# Patient Record
Sex: Male | Born: 1960 | Race: White | Hispanic: No | Marital: Married | Smoking: Never smoker
Health system: Southern US, Community
[De-identification: ages and names within clinical notes are randomized; demographics above are authoritative.]

## PROBLEM LIST (undated history)

## (undated) DIAGNOSIS — I4510 Unspecified right bundle-branch block: Secondary | ICD-10-CM

## (undated) DIAGNOSIS — J189 Pneumonia, unspecified organism: Secondary | ICD-10-CM

---

## 1989-04-25 DIAGNOSIS — J189 Pneumonia, unspecified organism: Secondary | ICD-10-CM

## 1989-04-25 HISTORY — DX: Pneumonia, unspecified organism: J18.9

## 1992-08-25 HISTORY — PX: INGUINAL HERNIA REPAIR: SUR1180

## 2014-03-02 ENCOUNTER — Inpatient Hospital Stay (HOSPITAL_COMMUNITY)
Admission: EM | Admit: 2014-03-02 | Discharge: 2014-03-03 | DRG: 310 | Disposition: A | Discharge status: Home / Self Care | Payer: Federal, State, Local not specified - PPO | Attending: Cardiology | Admitting: Cardiology

## 2014-03-02 ENCOUNTER — Emergency Department (HOSPITAL_COMMUNITY): Payer: Federal, State, Local not specified - PPO

## 2014-03-02 ENCOUNTER — Encounter (HOSPITAL_COMMUNITY): Payer: Self-pay | Admitting: Emergency Medicine

## 2014-03-02 DIAGNOSIS — Z800 Family history of malignant neoplasm of digestive organs: Secondary | ICD-10-CM

## 2014-03-02 DIAGNOSIS — I4891 Unspecified atrial fibrillation: Secondary | ICD-10-CM

## 2014-03-02 DIAGNOSIS — Z8249 Family history of ischemic heart disease and other diseases of the circulatory system: Secondary | ICD-10-CM

## 2014-03-02 DIAGNOSIS — Z79899 Other long term (current) drug therapy: Secondary | ICD-10-CM

## 2014-03-02 DIAGNOSIS — Z801 Family history of malignant neoplasm of trachea, bronchus and lung: Secondary | ICD-10-CM

## 2014-03-02 DIAGNOSIS — R7309 Other abnormal glucose: Secondary | ICD-10-CM | POA: Diagnosis present

## 2014-03-02 DIAGNOSIS — I420 Dilated cardiomyopathy: Secondary | ICD-10-CM | POA: Diagnosis not present

## 2014-03-02 DIAGNOSIS — R7301 Impaired fasting glucose: Secondary | ICD-10-CM

## 2014-03-02 DIAGNOSIS — Z7982 Long term (current) use of aspirin: Secondary | ICD-10-CM

## 2014-03-02 DIAGNOSIS — I428 Other cardiomyopathies: Secondary | ICD-10-CM | POA: Diagnosis present

## 2014-03-02 HISTORY — DX: Unspecified right bundle-branch block: I45.10

## 2014-03-02 HISTORY — DX: Pneumonia, unspecified organism: J18.9

## 2014-03-02 LAB — CBC WITH DIFFERENTIAL/PLATELET
BASOS ABS: 0 10*3/uL (ref 0.0–0.1)
BASOS PCT: 0 % (ref 0–1)
BASOS PCT: 0 % (ref 0–1)
Basophils Absolute: 0 10*3/uL (ref 0.0–0.1)
EOS ABS: 0.1 10*3/uL (ref 0.0–0.7)
Eosinophils Absolute: 0.1 10*3/uL (ref 0.0–0.7)
Eosinophils Relative: 2 % (ref 0–5)
Eosinophils Relative: 1 % (ref 0–5)
HCT: 51.3 % (ref 39.0–52.0)
HCT: 46.3 % (ref 39.0–52.0)
HEMOGLOBIN: 18.2 g/dL — AB (ref 13.0–17.0)
Hemoglobin: 16.1 g/dL (ref 13.0–17.0)
Lymphocytes Relative: 27 % (ref 12–46)
Lymphocytes Relative: 20 % (ref 12–46)
Lymphs Abs: 1.9 10*3/uL (ref 0.7–4.0)
Lymphs Abs: 1.7 10*3/uL (ref 0.7–4.0)
MCH: 31.7 pg (ref 26.0–34.0)
MCH: 31.3 pg (ref 26.0–34.0)
MCHC: 35.5 g/dL (ref 30.0–36.0)
MCHC: 34.8 g/dL (ref 30.0–36.0)
MCV: 89.2 fL (ref 78.0–100.0)
MCV: 89.9 fL (ref 78.0–100.0)
MONOS PCT: 12 % (ref 3–12)
MONOS PCT: 7 % (ref 3–12)
Monocytes Absolute: 0.9 10*3/uL (ref 0.1–1.0)
Monocytes Absolute: 0.6 10*3/uL (ref 0.1–1.0)
NEUTROS ABS: 4.3 10*3/uL (ref 1.7–7.7)
NEUTROS ABS: 6.1 10*3/uL (ref 1.7–7.7)
NEUTROS PCT: 59 % (ref 43–77)
NEUTROS PCT: 72 % (ref 43–77)
Platelets: 209 10*3/uL (ref 150–400)
Platelets: 215 10*3/uL (ref 150–400)
RBC: 5.75 MIL/uL (ref 4.22–5.81)
RBC: 5.15 MIL/uL (ref 4.22–5.81)
RDW: 12.8 % (ref 11.5–15.5)
RDW: 12.8 % (ref 11.5–15.5)
WBC: 7.3 10*3/uL (ref 4.0–10.5)
WBC: 8.5 10*3/uL (ref 4.0–10.5)

## 2014-03-02 LAB — MAGNESIUM
MAGNESIUM: 2.1 mg/dL (ref 1.5–2.5)
Magnesium: 2.3 mg/dL (ref 1.5–2.5)

## 2014-03-02 LAB — COMPREHENSIVE METABOLIC PANEL
ALT: 16 U/L (ref 0–53)
AST: 19 U/L (ref 0–37)
Albumin: 3.9 g/dL (ref 3.5–5.2)
Alkaline Phosphatase: 85 U/L (ref 39–117)
Anion gap: 14 (ref 5–15)
BUN: 14 mg/dL (ref 6–23)
CO2: 26 mEq/L (ref 19–32)
Calcium: 8.8 mg/dL (ref 8.4–10.5)
Chloride: 100 mEq/L (ref 96–112)
Creatinine, Ser: 0.94 mg/dL (ref 0.50–1.35)
GFR calc Af Amer: >90 mL/min (ref >90)
Glucose, Bld: 110 mg/dL — ABNORMAL HIGH (ref 70–99)
Potassium: 4.1 mEq/L (ref 3.7–5.3)
SODIUM: 140 meq/L (ref 137–147)
Total Bilirubin: 0.4 mg/dL (ref 0.3–1.2)
Total Protein: 6.7 g/dL (ref 6.0–8.3)

## 2014-03-02 LAB — BASIC METABOLIC PANEL
ANION GAP: 16 — AB (ref 5–15)
BUN: 16 mg/dL (ref 6–23)
CO2: 24 mEq/L (ref 19–32)
CREATININE: 0.95 mg/dL (ref 0.50–1.35)
Calcium: 9.5 mg/dL (ref 8.4–10.5)
Chloride: 104 mEq/L (ref 96–112)
Glucose, Bld: 114 mg/dL — ABNORMAL HIGH (ref 70–99)
Potassium: 4 mEq/L (ref 3.7–5.3)
Sodium: 144 mEq/L (ref 137–147)

## 2014-03-02 LAB — D-DIMER, QUANTITATIVE: D-Dimer, Quant: <0.27 ug/mL-FEU (ref 0.00–0.48)

## 2014-03-02 LAB — PROTIME-INR
INR: 1.1 (ref 0.00–1.49)
Prothrombin Time: 14.2 seconds (ref 11.6–15.2)

## 2014-03-02 LAB — T4, FREE: FREE T4: 1.11 ng/dL (ref 0.80–1.80)

## 2014-03-02 LAB — TROPONIN I
Troponin I: <0.3 ng/mL (ref <0.30)
Troponin I: <0.3 ng/mL (ref <0.30)
Troponin I: <0.3 ng/mL (ref <0.30)

## 2014-03-02 LAB — PRO B NATRIURETIC PEPTIDE: Pro B Natriuretic peptide (BNP): 199.9 pg/mL — ABNORMAL HIGH (ref 0–125)

## 2014-03-02 LAB — TSH: TSH: 2.02 u[IU]/mL (ref 0.350–4.500)

## 2014-03-02 LAB — APTT: aPTT: 34 seconds (ref 24–37)

## 2014-03-02 MED ORDER — AMIODARONE LOAD VIA INFUSION
150.0000 mg | Freq: Once | INTRAVENOUS | Status: AC
  Administered 2014-03-02: 150 mg via INTRAVENOUS
  Filled 2014-03-02: qty 83.34

## 2014-03-02 MED ORDER — OFF THE BEAT BOOK
Freq: Once | Status: AC
  Administered 2014-03-02: 23:00:00
  Filled 2014-03-02: qty 1

## 2014-03-02 MED ORDER — AMIODARONE HCL IN DEXTROSE 360-4.14 MG/200ML-% IV SOLN
60.0000 mg/h | INTRAVENOUS | Status: AC
  Administered 2014-03-02 (×2): 60 mg/h via INTRAVENOUS
  Filled 2014-03-02 (×2): qty 200

## 2014-03-02 MED ORDER — ACETAMINOPHEN 325 MG PO TABS: 650.0000 mg | ORAL_TABLET | ORAL | Status: DC | PRN

## 2014-03-02 MED ORDER — ONDANSETRON HCL 4 MG/2ML IJ SOLN
4.0000 mg | Freq: Four times a day (QID) | INTRAMUSCULAR | Status: DC | PRN
Start: 2014-03-02 — End: 2014-03-03

## 2014-03-02 MED ORDER — DILTIAZEM LOAD VIA INFUSION
10.0000 mg | Freq: Once | INTRAVENOUS | Status: DC
  Filled 2014-03-02: qty 10

## 2014-03-02 MED ORDER — METOPROLOL TARTRATE 12.5 MG HALF TABLET
12.5000 mg | ORAL_TABLET | Freq: Two times a day (BID) | ORAL | Status: DC
  Administered 2014-03-02: 12.5 mg via ORAL
  Filled 2014-03-02 (×3): qty 1

## 2014-03-02 MED ORDER — DILTIAZEM HCL 100 MG IV SOLR
5.0000 mg/h | INTRAVENOUS | Status: DC
  Filled 2014-03-02: qty 100

## 2014-03-02 MED ORDER — SODIUM CHLORIDE 0.9 % IV SOLN
INTRAVENOUS | Status: DC
  Administered 2014-03-02 (×2): via INTRAVENOUS

## 2014-03-02 MED ORDER — AMIODARONE HCL IN DEXTROSE 360-4.14 MG/200ML-% IV SOLN
30.0000 mg/h | INTRAVENOUS | Status: DC
  Filled 2014-03-02 (×3): qty 200

## 2014-03-02 NOTE — ED Notes (Addendum)
Patient noted to be in NSR, repeat EKG performed and given to Dr. Richrd Prime. Marylu Lund from 3W updated.

## 2014-03-02 NOTE — ED Notes (Signed)
Brought pt back to room via wheelchair; pt needs to go to the bathroom first; having pt to provide an urine specimen at this time

## 2014-03-02 NOTE — ED Notes (Signed)
Pt has returned from the bathroom, pt undressed, in gown, on monitor, continuous pulse oximetry and blood pressure cuff; Clarene Duke, MD at bedside speaking with pt; family at bedside; Clydie Braun, RN present in room

## 2014-03-02 NOTE — ED Notes (Signed)
Per pt sts irregular heart beat for the past few hours. sts that usually it goes away. sts hx or RBBB. Denies hx of a fib.

## 2014-03-02 NOTE — Progress Notes (Signed)
  Echocardiogram 2D Echocardiogram has been performed.  Georgian Co 03/02/2014, 5:10 PM

## 2014-03-02 NOTE — H&P (Signed)
Admit date: 03/02/2014 Referring Physician:  Dr. Clarene Duke Primary Cardiologist:  Dr. Reginia Forts in [facility] Chief complaint/reason for admission: afib with RVR  HPI: This is a 53yo WM with a history of RBBB with remote nuclear stress test that was normal who presented to the ER with complaints of palpitations.  He says that 7am he noticed that he heart started going into a rapid rate and it continued and he went to the ER.  He denies any chest pain, SOB, DOE, LE edema, dizziness, or syncope.  He has had palpitations in the past for a few seconds but nothing ever sustained.   He denies any history of afib in the past.  He denies any recent fever or chills.  He is visiting here from [place] and drove down about a week and a half ago and then drove to [place] and then came back to [place].     PMH:    Past Medical History  Diagnosis Date  . RBBB     PSH:    Past Surgical History  Procedure Laterality Date  . Inguinal hernia repair      ALLERGIES:   Review of patient's allergies indicates no known allergies.  Prior to Admit Meds:   (Not in a hospital admission) Family HX:    Family History  Problem Relation Age of Onset  . Lung cancer Mother   . Heart attack Mother   . Heart disease Mother   . Colon cancer Father    Social HX:    History   Social History  . Marital Status: Married    Spouse Name: N/A    Number of Children: N/A  . Years of Education: N/A   Occupational History  . Not on file.   Social History Main Topics  . Smoking status: Never Smoker   . Smokeless tobacco: Not on file  . Alcohol Use: 4.2 oz/week    7 Cans of beer per week  . Drug Use: Not on file  . Sexual Activity: Not on file   Other Topics Concern  . Not on file   Social History Narrative  . No narrative on file     ROS:  All 11 ROS were addressed and are negative except what is stated in the HPI  PHYSICAL EXAM Filed Vitals:   03/02/14 1122  BP: 116/72  Pulse:   Temp:   Resp: 22    General: Well developed, well nourished, in no acute distress Head: Eyes PERRLA, No xanthomas.   Normal cephalic and atramatic  Lungs:   Clear bilaterally to auscultation and percussion. Heart:   Irregularly irregular S1 S2 Pulses are 2+ & equal.            No carotid bruit. No JVD.  No abdominal bruits. No femoral bruits. Abdomen: Bowel sounds are positive, abdomen soft and non-tender without masses Extremities:   No clubbing, cyanosis or edema.  DP +1 Neuro: Alert and oriented X 3. Psych:  Good affect, responds appropriately   Labs:   Lab Results  Component Value Date   WBC 7.3 03/02/2014   HGB 18.2* 03/02/2014   HCT 51.3 03/02/2014   MCV 89.2 03/02/2014   PLT 209 03/02/2014     Recent Labs Lab 03/02/14 0910  NA 144  K 4.0  CL 104  CO2 24  BUN 16  CREATININE 0.95  CALCIUM 9.5  GLUCOSE 114*   Lab Results  Component Value Date   TROPONINI <0.30 03/02/2014   No  results found for this basename: PTT   No results found for this basename: INR,  PROTIME     No results found for this basename: CHOL   No results found for this basename: HDL   No results found for this basename: LDLCALC   No results found for this basename: TRIG   No results found for this basename: CHOLHDL   No results found for this basename: LDLDIRECT      Radiology:  No results found.  EKG:  Atrial fibrillation with RVR and nonspecific IVCD  ASSESSMENT:  1.  New onset of atrial fibrillation with RVR.  His CHADS2VASC is 0. 2.  History of RBBB  PLAN:   1.  Start Cardizem gtt and titrate to get HR <100 and continue IV Amio gtt 2.  Check d-dimer with recent long distance driving - he denies CP or SOB but with new onset afib need to consider 3.  Check TSH 4.  IV Heparin gtt 5.  2D echo to assess LVF  Quintella ReichertURNER,TRACI R, MD  03/02/2014  12:37 PM

## 2014-03-02 NOTE — ED Notes (Signed)
Woke up this am, felt rapid heart rate start at approx 7am, hx of same but only lasting approx few minutes and resolves on own. Here visiting from [place]-- has a cardiologist in [place]. Was on Amoxicillan until Monday, for a gum infection.

## 2014-03-02 NOTE — ED Provider Notes (Signed)
CSN: [id number]     Arrival date & time 03/02/14  4098 History   First MD Initiated Contact with Patient 03/02/14 [phone]     Chief Complaint  Patient presents with  . Irregular Heart Beat      HPI Pt was seen at 0855.  Per pt, c/o sudden onset and persistence of constant "palpitations" since waking up approximately 0700 PTA. Pt states he has hx of palpitations "but it usually goes away on it's own." States he has had workup for same in [place] and was told his palpitations were not caused by an arrhythmia. Describes his palpitations as a "fast HR" and "irregular HR." Denies CP/SOB, no cough, no abd pain, no N/V/D, no back pain, no fevers.    Past Medical History  Diagnosis Date  . RBBB    History reviewed. No pertinent past surgical history.  History  Substance Use Topics  . Smoking status: Never Smoker   . Smokeless tobacco: Not on file  . Alcohol Use: Yes    Review of Systems ROS: Statement: All systems negative except as marked or noted in the HPI; Constitutional: Negative for fever and chills. ; ; Eyes: Negative for eye pain, redness and discharge. ; ; ENMT: Negative for ear pain, hoarseness, nasal congestion, sinus pressure and sore throat. ; ; Cardiovascular: +palpitations. Negative for chest pain, diaphoresis, dyspnea and peripheral edema. ; ; Respiratory: Negative for cough, wheezing and stridor. ; ; Gastrointestinal: Negative for nausea, vomiting, diarrhea, abdominal pain, blood in stool, hematemesis, jaundice and rectal bleeding. . ; ; Genitourinary: Negative for dysuria, flank pain and hematuria. ; ; Musculoskeletal: Negative for back pain and neck pain. Negative for swelling and trauma.; ; Skin: Negative for pruritus, rash, abrasions, blisters, bruising and skin lesion.; ; Neuro: Negative for headache, lightheadedness and neck stiffness. Negative for weakness, altered level of consciousness , altered mental status, extremity weakness, paresthesias, involuntary movement, seizure and  syncope.     Allergies  Review of patient's allergies indicates no known allergies.  Home Medications   Prior to Admission medications   Not on File   BP 139/98  Pulse 135  Temp(Src) 98.3 F (36.8 C)  Resp 18  Wt 170 lb (77.111 kg)  SpO2 98% Physical Exam 0900:  Physical examination:  Nursing notes reviewed; Vital signs and O2 SAT reviewed;  Constitutional: Well developed, Well nourished, Well hydrated, In no acute distress; Head:  Normocephalic, atraumatic; Eyes: EOMI, PERRL, No scleral icterus; ENMT: Mouth and pharynx normal, Mucous membranes moist; Neck: Supple, Full range of motion, No lymphadenopathy; Cardiovascular: Tachycardic rate and irregular irregular rhythm, No gallop; Respiratory: Breath sounds clear & equal bilaterally, No rales, rhonchi, wheezes.  Speaking full sentences with ease, Normal respiratory effort/excursion; Chest: Nontender, Movement normal; Abdomen: Soft, Nontender, Nondistended, Normal bowel sounds; Genitourinary: No CVA tenderness; Extremities: Pulses normal, No tenderness, No edema, No calf edema or asymmetry.; Neuro: AA&Ox3, Major CN grossly intact.  Speech clear. No gross focal motor or sensory deficits in extremities. Climbs on and off stretcher easily by himself. Gait steady.; Skin: Color normal, Warm, Dry.   ED Course  Procedures   [phone]:  Pt c/o palpitations on arrival to ED. Monitor wide complex irregular rhythm with LBBB, rates 150-170's. Pt denies hx of same. Will dose IV amiodarone bolus and gtt.   1000:  Monitor with afib, HR now 100-110's while on IV amiodarone. Pt states he feels "better," and only c/o "my heartbeat feels like it's jumping around all over." Dx and testing d/w pt  and family.  Questions answered.  Verb understanding, agreeable to admit.  T/C to Cardiology, case discussed, including:  HPI, pertinent PM/SHx, VS/PE, dx testing, ED course and treatment:  Agreeable to come to ED for evaluation.      EKG  Interpretation   Date/Time:  Thursday March 02 2014 09:11:54 EDT Ventricular Rate:  170 PR Interval:  44 QRS Duration: 126 QT Interval:  319 QTC Calculation: 536 R Axis:   125 Text Interpretation:  Atrial fibrillation with rapid ventricular response  Multiform ventricular premature complexes Aberrant complex Nonspecific  intraventricular conduction delay No old tracing to compare Confirmed by  [facility]  MD, Jeter Tomey [phone]) on 03/02/2014 9:19:10 AM      EKG Interpretation  Date/Time:  Thursday March 02 2014 13:30:29 EDT Ventricular Rate:  84 PR Interval:  131 QRS Duration: 134 QT Interval:  379 QTC Calculation: 448 R Axis:   102 Text Interpretation:  Sinus rhythm Nonspecific intraventricular conduction delay Nonspecific ST and T wave abnormality Since last tracing of earlier today Rate slower Confirmed by [facility]  MD, Nicholos Johns [phone]) on 03/02/2014 1:35:25 PM          MDM  MDM Reviewed: nursing note and vitals Interpretation: labs, ECG and x-ray Total time providing critical care: 30-74 minutes. This excludes time spent performing separately reportable procedures and services. Consults: cardiology    CRITICAL CARE Performed by: Laray Anger Total critical care time: 35 Critical care time was exclusive of separately billable procedures and treating other patients. Critical care was necessary to treat or prevent imminent or life-threatening deterioration. Critical care was time spent personally by me on the following activities: development of treatment plan with patient and/or surrogate as well as nursing, discussions with consultants, evaluation of patient's response to treatment, examination of patient, obtaining history from patient or surrogate, ordering and performing treatments and interventions, ordering and review of laboratory studies, ordering and review of radiographic studies, pulse oximetry and re-evaluation of patient's condition.    Date: 03/02/2014 on  arrival  Rate: 128  Rhythm: atrial fibrillation with RVR  QRS Axis: normal  Intervals: normal  ST/T Wave abnormalities: nonspecific ST/T changes  Conduction Disutrbances:left bundle branch block  Narrative Interpretation:   Old EKG Reviewed: none available.   Date: 03/02/2014 ED RN repeated   Rate: 170  Rhythm: atrial fibrillation with RVR  QRS Axis: normal  Intervals: normal  ST/T Wave abnormalities: nonspecific ST/T changes  Conduction Disutrbances:left bundle branch block  Narrative Interpretation:   Old EKG Reviewed: unchanged from previous EKG completed today.   Date: 03/02/2014 repeat  Rate: 84   Rhythm: normal sinus rhythm  QRS Axis: normal  Intervals: normal  ST/T Wave abnormalities: nonspecific ST/T changes  Conduction Disutrbances:nonspecific intraventricular conduction delay  Narrative Interpretation:   Old EKG Reviewed: changes noted, rate slower.   Results for orders placed during the hospital encounter of 03/02/14  CBC WITH DIFFERENTIAL      Result Value Ref Range   WBC 7.3  4.0 - 10.5 K/uL   RBC 5.75  4.22 - 5.81 MIL/uL   Hemoglobin 18.2 (*) 13.0 - 17.0 g/dL   HCT 70.3  50.0 - 93.8 %   MCV 89.2  78.0 - 100.0 fL   MCH 31.7  26.0 - 34.0 pg   MCHC 35.5  30.0 - 36.0 g/dL   RDW 18.2  99.3 - 71.6 %   Platelets 209  150 - 400 K/uL   Neutrophils Relative % 59  43 - 77 %   Neutro  Abs 4.3  1.7 - 7.7 K/uL   Lymphocytes Relative 27  12 - 46 %   Lymphs Abs 1.9  0.7 - 4.0 K/uL   Monocytes Relative 12  3 - 12 %   Monocytes Absolute 0.9  0.1 - 1.0 K/uL   Eosinophils Relative 2  0 - 5 %   Eosinophils Absolute 0.1  0.0 - 0.7 K/uL   Basophils Relative 0  0 - 1 %   Basophils Absolute 0.0  0.0 - 0.1 K/uL  BASIC METABOLIC PANEL      Result Value Ref Range   Sodium 144  137 - 147 mEq/L   Potassium 4.0  3.7 - 5.3 mEq/L   Chloride 104  96 - 112 mEq/L   CO2 24  19 - 32 mEq/L   Glucose, Bld 114 (*) 70 - 99 mg/dL   BUN 16  6 - 23 mg/dL   Creatinine, Ser 1.610.95  0.50 -  1.35 mg/dL   Calcium 9.5  8.4 - 09.610.5 mg/dL   GFR calc non Af Amer >90  >90 mL/min   GFR calc Af Amer >90  >90 mL/min   Anion gap 16 (*) 5 - 15  TROPONIN I      Result Value Ref Range   Troponin I <0.30  <0.30 ng/mL  MAGNESIUM      Result Value Ref Range   Magnesium 2.3  1.5 - 2.5 mg/dL   Dg Chest Port 1 View 03/02/2014   CLINICAL DATA:  Irregular heartbeat  EXAM: PORTABLE CHEST - 1 VIEW  COMPARISON:  None available  FINDINGS: Normal mediastinum and cardiac silhouette. Normal pulmonary vasculature. No evidence of effusion, infiltrate, or pneumothorax. No acute bony abnormality.  IMPRESSION: No acute cardiopulmonary process.   Electronically Signed   By: Genevive BiStewart  Edmunds M.D.   On: 03/02/2014 09:29     Laray AngerKathleen M Kohan Azizi, DO 03/04/14 1440

## 2014-03-03 ENCOUNTER — Encounter (HOSPITAL_COMMUNITY): Payer: Federal, State, Local not specified - PPO

## 2014-03-03 ENCOUNTER — Inpatient Hospital Stay (HOSPITAL_COMMUNITY): Payer: Federal, State, Local not specified - PPO

## 2014-03-03 DIAGNOSIS — I4891 Unspecified atrial fibrillation: Secondary | ICD-10-CM

## 2014-03-03 DIAGNOSIS — I428 Other cardiomyopathies: Secondary | ICD-10-CM

## 2014-03-03 DIAGNOSIS — I420 Dilated cardiomyopathy: Secondary | ICD-10-CM | POA: Diagnosis not present

## 2014-03-03 DIAGNOSIS — R7301 Impaired fasting glucose: Secondary | ICD-10-CM | POA: Diagnosis present

## 2014-03-03 LAB — BASIC METABOLIC PANEL
Anion gap: 15 (ref 5–15)
BUN: 15 mg/dL (ref 6–23)
CHLORIDE: 103 meq/L (ref 96–112)
CO2: 21 meq/L (ref 19–32)
CREATININE: 0.99 mg/dL (ref 0.50–1.35)
Calcium: 8.6 mg/dL (ref 8.4–10.5)
GFR calc Af Amer: >90 mL/min (ref >90)
GFR calc non Af Amer: >90 mL/min (ref >90)
Glucose, Bld: 103 mg/dL — ABNORMAL HIGH (ref 70–99)
POTASSIUM: 4.4 meq/L (ref 3.7–5.3)
Sodium: 139 mEq/L (ref 137–147)

## 2014-03-03 LAB — TROPONIN I: Troponin I: <0.3 ng/mL (ref <0.30)

## 2014-03-03 LAB — HEMOGLOBIN A1C
Hgb A1c MFr Bld: 5.5 % (ref <5.7)
MEAN PLASMA GLUCOSE: 111 mg/dL (ref <117)

## 2014-03-03 MED ORDER — RAMIPRIL 2.5 MG PO CAPS
2.5000 mg | ORAL_CAPSULE | Freq: Every day | ORAL | Status: DC
  Administered 2014-03-03: 2.5 mg via ORAL
  Filled 2014-03-03: qty 1

## 2014-03-03 MED ORDER — TECHNETIUM TC 99M SESTAMIBI - CARDIOLITE
30.0000 | Freq: Once | INTRAVENOUS | Status: AC | PRN
  Administered 2014-03-03: 30 via INTRAVENOUS

## 2014-03-03 MED ORDER — CARVEDILOL 3.125 MG PO TABS
3.1250 mg | ORAL_TABLET | Freq: Two times a day (BID) | ORAL | Status: DC
  Administered 2014-03-03: 3.125 mg via ORAL
  Filled 2014-03-03 (×3): qty 1

## 2014-03-03 MED ORDER — RAMIPRIL 2.5 MG PO CAPS: 2.5000 mg | ORAL_CAPSULE | Freq: Every day | ORAL | Status: AC

## 2014-03-03 MED ORDER — REGADENOSON 0.4 MG/5ML IV SOLN
0.4000 mg | Freq: Once | INTRAVENOUS | Status: AC
  Administered 2014-03-03: 0.4 mg via INTRAVENOUS
  Filled 2014-03-03: qty 5

## 2014-03-03 MED ORDER — ASPIRIN 81 MG PO CHEW: 81.0000 mg | CHEWABLE_TABLET | Freq: Every day | ORAL | Status: AC

## 2014-03-03 MED ORDER — REGADENOSON 0.4 MG/5ML IV SOLN
INTRAVENOUS | Status: AC
  Filled 2014-03-03: qty 5

## 2014-03-03 MED ORDER — ASPIRIN 81 MG PO CHEW
81.0000 mg | CHEWABLE_TABLET | Freq: Every day | ORAL | Status: DC
  Administered 2014-03-03: 81 mg via ORAL
  Filled 2014-03-03: qty 1

## 2014-03-03 MED ORDER — CARVEDILOL 3.125 MG PO TABS: 3.1250 mg | ORAL_TABLET | Freq: Two times a day (BID) | ORAL | Status: AC

## 2014-03-03 MED ORDER — TECHNETIUM TC 99M SESTAMIBI GENERIC - CARDIOLITE
10.0000 | Freq: Once | INTRAVENOUS | Status: AC | PRN
  Administered 2014-03-03: 10 via INTRAVENOUS

## 2014-03-03 NOTE — Progress Notes (Signed)
SUBJECTIVE:  Denies any CP or SOB  OBJECTIVE:   Vitals:   Filed Vitals:   03/02/14 1315 03/02/14 1409 03/02/14 2134 03/03/14 0500  BP: 114/83 127/84 114/84 118/79  Pulse: 83  74 78  Temp:  98.8 F (37.1 C) 98.5 F (36.9 C) 97.9 F (36.6 C)  TempSrc:  Oral Oral Oral  Resp: 15 17 18 18   Height:  5\' 11"  (1.803 m)    Weight:  173 lb 8 oz (78.699 kg)  173 lb 9.6 oz (78.744 kg)  SpO2: 96% 97% 96% 98%   I&O's:   Intake/Output Summary (Last 24 hours) at 03/03/14 0755 Last data filed at 03/02/14 1205  Gross per 24 hour  Intake    200 ml  Output      0 ml  Net    200 ml   TELEMETRY: Reviewed telemetry pt in NSR:     PHYSICAL EXAM General: Well developed, well nourished, in no acute distress Head: Eyes PERRLA, No xanthomas.   Normal cephalic and atramatic  Lungs:   Clear bilaterally to auscultation and percussion. Heart:   HRRR S1 S2 Pulses are 2+ & equal. Abdomen: Bowel sounds are positive, abdomen soft and non-tender without masses  Extremities:   No clubbing, cyanosis or edema.  DP +1 Neuro: Alert and oriented X 3. Psych:  Good affect, responds appropriately   LABS: Basic Metabolic Panel:  Recent Labs  16/05/9606/09/15 0910 03/02/14 1510 03/03/14 0445  NA 144 140 139  K 4.0 4.1 4.4  CL 104 100 103  CO2 24 26 21   GLUCOSE 114* 110* 103*  BUN 16 14 15   CREATININE 0.95 0.94 0.99  CALCIUM 9.5 8.8 8.6  MG 2.3 2.1  --    Liver Function Tests:  Recent Labs  03/02/14 1510  AST 19  ALT 16  ALKPHOS 85  BILITOT 0.4  PROT 6.7  ALBUMIN 3.9   No results found for this basename: LIPASE, AMYLASE,  in the last 72 hours CBC:  Recent Labs  03/02/14 0910 03/02/14 1510  WBC 7.3 8.5  NEUTROABS 4.3 6.1  HGB 18.2* 16.1  HCT 51.3 46.3  MCV 89.2 89.9  PLT 209 215   Cardiac Enzymes:  Recent Labs  03/02/14 1510 03/02/14 2055 03/03/14 0445  TROPONINI <0.30 <0.30 <0.30   BNP: No components found with this basename: POCBNP,  D-Dimer:  Recent Labs  03/02/14 1253    DDIMER <0.27   Hemoglobin A1C: No results found for this basename: HGBA1C,  in the last 72 hours Fasting Lipid Panel: No results found for this basename: CHOL, HDL, LDLCALC, TRIG, CHOLHDL, LDLDIRECT,  in the last 72 hours Thyroid Function Tests:  Recent Labs  03/02/14 1510  TSH 2.020   Anemia Panel: No results found for this basename: VITAMINB12, FOLATE, FERRITIN, TIBC, IRON, RETICCTPCT,  in the last 72 hours Coag Panel:   Lab Results  Component Value Date   INR 1.10 03/02/2014    RADIOLOGY: Dg Chest Port 1 View  03/02/2014   CLINICAL DATA:  Irregular heartbeat  EXAM: PORTABLE CHEST - 1 VIEW  COMPARISON:  None available  FINDINGS: Normal mediastinum and cardiac silhouette. Normal pulmonary vasculature. No evidence of effusion, infiltrate, or pneumothorax. No acute bony abnormality.  IMPRESSION: No acute cardiopulmonary process.   Electronically Signed   By: Genevive BiStewart  Edmunds M.D.   On: 03/02/2014 09:29    ASSESSMENT:  1. New onset of atrial fibrillation with RVR. His CHADS2VASC is 0. He converted on NSR on IV Amio.  TSH is normal. 2. History of RBBB although on EKG in NSR he has a nonspecific IVCD 3.  DCM with EF 30-35% by echo of ? Etiology.  Echo in the past reportedly normal.  He denies any history of chest pain or SOB.  He also clearly states that palpitations occurred starting yesterday am and none before that so unlikely to be tachycardia mediated. 4.  Elevated FBS  PLAN:  1. Change Lopressor to Coreg 3.125mg  BID 2. Add Altace 2.5mg  daily and titrate as BP allows 3. ASA 81mg  daily 4. Since PAF lasted only a few hours and CHADS2VASC is 0 will give ASA only at this time. 5. Will try to get old records from [place] 6. Lexiscan myoview today to rule out ischemia 7.  Check HbgA1C      Quintella Reichert, MD  03/03/2014  7:55 AM

## 2014-03-03 NOTE — Progress Notes (Signed)
D/c instructions were reviewed with pt and his wife. Copy of instructions and ASA script, others sent in to pt's pharmacy by MD/PA. Pt d/c'd via wheelchair with belongings, with wife, escorted by unit NT.

## 2014-03-03 NOTE — Discharge Summary (Signed)
CARDIOLOGY DISCHARGE SUMMARY   Patient ID: Nicholas Dudley MRN: [id number] DOB/AGE: 55-Dec-1962 55 y.o.  Admit date: 03/02/2014 Discharge date: 03/03/2014  PCP: No PCP Per Patient Primary Cardiologist: Dr. Reginia Forts, in [facility], [place]  Primary Discharge Diagnosis:  Atrial fibrillation with RVR Secondary Discharge Diagnosis:    DCM (dilated cardiomyopathy)   Elevated fasting blood sugar   Procedures: Lexi scan Cardiolite, 2-D echocardiogram  Hospital Course: Nicholas Dudley is a 53 y.o. male with no history of CAD. He has a chronic right bundle branch block and a remote nuclear stress test. He developed palpitations at approximately 7 AM on 07/09. He felt the onset of the palpitations went to the ER. He did not have chest pain, presyncope or shortness of breath with these. In the ER, he was in rapid atrial fibrillation. He was admitted for further evaluation and treatment.  He was started on IV amiodarone. He spontaneously converted to sinus rhythm , within 12 hours of the onset of his palpitations. He was not started on systemic anticoagulation because his chads score is 0. He had an echocardiogram which showed a decreased EF. His EF was 30-35%, diffuse wall hypokinesis and diastolic dysfunction. Because of the abnormal EF, he was started on a low dose of carvedilol and ramipril. He is tolerating these medications well.  With a decreased EF, an ischemic evaluation was indicated. He had a Lexi scan Cardiolite on 07/10, results are below. The Cardiolite showed possible scar but no ischemia and an EF 51%. It is a low risk study.   On the afternoon of 07/10, Nicholas Dudley was reassessed. He was maintaining sinus rhythm and his vital signs were stable. No further inpatient workup is indicated and he is considered stable for discharge, to follow up with his primary cardiologist and with [facility] cardiology when necessary.   Labs:  Lab Results  Component Value Date   WBC 8.5  03/02/2014   HGB 16.1 03/02/2014   HCT 46.3 03/02/2014   MCV 89.9 03/02/2014   PLT 215 03/02/2014     Recent Labs Lab 03/02/14 1510 03/03/14 0445  NA 140 139  K 4.1 4.4  CL 100 103  CO2 26 21  BUN 14 15  CREATININE 0.94 0.99  CALCIUM 8.8 8.6  PROT 6.7  --   BILITOT 0.4  --   ALKPHOS 85  --   ALT 16  --   AST 19  --   GLUCOSE 110* 103*    Recent Labs  03/02/14 1510 03/02/14 2055 03/03/14 0445  TROPONINI <0.30 <0.30 <0.30   Pro B Natriuretic peptide (BNP)  Date/Time Value Ref Range Status  03/02/2014  3:10 PM 199.9* 0 - 125 pg/mL Final    Recent Labs  03/02/14 1510  INR 1.10   Lab Results  Component Value Date   TSH 2.020 03/02/2014      Radiology: Nm Myocar Multi W/spect W/wall Motion / Ef 03/03/2014   HISTORY OF PRESENT ILLNESS: Nuclear Med Background  Indication for Stress Test:  New onset a-fib  History: This is a 52yo WM with a history of RBBB with remote nuclear stress test that was normal who presented to the ER with complaints of palpitations. He says that 7am he noticed that he heart started going into a rapid rate and it continued and he went to the ER. He denies any chest pain, SOB, DOE, LE edema, dizziness, or syncope. He has had palpitations in the past for a few seconds but nothing ever  sustained. He denies any history of afib in the past. He denies any recent fever or chills. He is visiting here from [place] and drove down about a week and a half ago and then drove to [place] and then came back to [place].  Cardiac Risk Factors: LBBB  Symptoms: palpitations  PROCEDURE: Nuclear Pre-Procedure  Caffeine/Decaff Intake:  NPO After: MN.  Lungs:  O2 Stat:  IV 0.9% NS with Angio Cath:  Chest Size (in):  Cup Size:  Height: 5'11"  Weight:  173 lbs  BMI:  Tech Comments:  Nuclear Med Study  1 or 2 day study: 1  Stress Test Type: Lexiscan  Reading MD: Hilty  Order Authorizing Provider: Turner  Resting Radionuclide: Sestamibi  Resting Radionuclide Dose:  10 mCi  Stress Radionuclide: Sestamibi   Stress Radionuclide Dose:  30 mCi  Stress Protocol  Rest HR: 93  Stress HR: 107  Rest BP: 134/83  Stress BP: 117/73  Exercise Time (min):  METS:  Dose of Adenosine (mg):  Dose of Lexiscan:  0.4 mg  Dose of Atropine (mg):  Dose of Dobutamine:  Stress Test Technologist:  Nuclear Technologist:  Rest Procedure:  Stress Procedure:  Transient Ischemic Dilatation (Normal <1.22): 1.08  Lung/Heart Ratio (Normal <0.45): 0.31  QGS EDV:  104 ml  QGS ESV:  51 ml  LV Ejection Fraction: 51%  Rest ECG:  NSR with LBBB  Stress ECG: unchanged  Raw Data Images:  No significant artifact or motion  Stress Images:  Fixed septal defect  Rest Images:  Fixed septal defect  Subtraction (SDS):  No significant ischemia  IMPRESSION: Exercise Capacity: N/A  BP Response: normal  Clinical Symptoms: None  ECG Impression: Non-diagnostic  Comparison with Prior Nuclear Study: none  Final Impression:  Low risk stress test with fixed septal defect, likely secondary to LBBB. No reversible ischemia. There is incoordinate septal motion. LVEF 51%.   Electronically Signed   By: Chrystie NoseKenneth C Hilty   On: 03/03/2014 14:41   Dg Chest Port 1 View 03/02/2014   CLINICAL DATA:  Irregular heartbeat  EXAM: PORTABLE CHEST - 1 VIEW  COMPARISON:  None available  FINDINGS: Normal mediastinum and cardiac silhouette. Normal pulmonary vasculature. No evidence of effusion, infiltrate, or pneumothorax. No acute bony abnormality.  IMPRESSION: No acute cardiopulmonary process.   Electronically Signed   By: Genevive BiStewart  Edmunds M.D.   On: 03/02/2014 09:29   EKG:02-Mar-2014 09:11:54   Atrial fibrillation with rapid ventricular response Multiform ventricular premature complexes Aberrant complex Nonspecific intraventricular conduction delay No old tracing to compare Vent. rate 170 BPM PR interval 44 ms QRS duration 126 ms QT/QTc 319/536 ms P-R-T axes 104 125 10   Echo: 03/02/2014 Conclusions - Left ventricle: The cavity size was normal. Wall thickness was normal. Systolic  function was moderately to severely reduced. The estimated ejection fraction was in the range of 30% to 35%. Diffuse hypokinesis. There was an increased relative contribution of atrial contraction to ventricular filling.   FOLLOW UP PLANS AND APPOINTMENTS No Known Allergies   Medication List         aspirin 81 MG chewable tablet  Chew 1 tablet (81 mg total) by mouth daily.     carvedilol 3.125 MG tablet  Commonly known as:  COREG  Take 1 tablet (3.125 mg total) by mouth 2 (two) times daily with a meal.     ramipril 2.5 MG capsule  Commonly known as:  ALTACE  Take 1 capsule (2.5 mg total) by mouth daily.  Discharge Instructions   Diet - low sodium heart healthy    Complete by:  As directed      Increase activity slowly    Complete by:  As directed           Follow-up Information   Follow up with Quintella Reichert, MD. (As needed)    Specialty:  Cardiology   Contact information:   [address] [place] [address] [phone]       BRING ALL MEDICATIONS WITH YOU TO FOLLOW UP APPOINTMENTS  Time spent with patient to include physician time: 40 min Signed: Theodore Demark, PA-C 03/03/2014, 3:50 PM Co-Sign MD

## 2014-03-03 NOTE — Progress Notes (Signed)
Pt a/o, no c/o pain, pt NPO for stress test, pt stable

## 2014-03-03 NOTE — Discharge Instructions (Signed)
Dr. Georga Kaufmann, MD  Address: [address] [place], [place] [address]  [phone]  Call for appointment.

## 2014-03-10 NOTE — Discharge Summary (Signed)
Agree with discharge summary as outlined by Rhonda Barrett, PA-C
